# Patient Record
Sex: Male | Born: 1980 | Race: White | Hispanic: No | Marital: Single | Smoking: Current every day smoker
Health system: Southern US, Community
[De-identification: ages and names within clinical notes are randomized; demographics above are authoritative.]

---

## 2001-01-29 ENCOUNTER — Emergency Department (HOSPITAL_COMMUNITY): Admission: EM | Admit: 2001-01-29 | Discharge: 2001-01-29 | Payer: Self-pay | Admitting: Emergency Medicine

## 2001-01-29 ENCOUNTER — Encounter: Payer: Self-pay | Admitting: Emergency Medicine

## 2002-12-28 ENCOUNTER — Emergency Department (HOSPITAL_COMMUNITY): Admission: EM | Admit: 2002-12-28 | Discharge: 2002-12-28 | Payer: Self-pay | Admitting: Emergency Medicine

## 2002-12-28 ENCOUNTER — Ambulatory Visit (HOSPITAL_COMMUNITY): Admission: RE | Admit: 2002-12-28 | Discharge: 2002-12-28 | Payer: Self-pay | Admitting: Emergency Medicine

## 2002-12-28 ENCOUNTER — Encounter: Payer: Self-pay | Admitting: Emergency Medicine

## 2004-06-01 ENCOUNTER — Emergency Department (HOSPITAL_COMMUNITY): Admission: EM | Admit: 2004-06-01 | Discharge: 2004-06-01 | Payer: Self-pay | Admitting: Emergency Medicine

## 2012-01-15 ENCOUNTER — Emergency Department (HOSPITAL_COMMUNITY)
Admission: EM | Admit: 2012-01-15 | Discharge: 2012-01-15 | Disposition: A | Discharge status: Home / Self Care | Payer: 59 | Attending: Emergency Medicine | Admitting: Emergency Medicine

## 2012-01-15 ENCOUNTER — Encounter (HOSPITAL_COMMUNITY): Payer: Self-pay

## 2012-01-15 ENCOUNTER — Emergency Department (HOSPITAL_COMMUNITY): Payer: 59

## 2012-01-15 DIAGNOSIS — R200 Anesthesia of skin: Secondary | ICD-10-CM

## 2012-01-15 DIAGNOSIS — R209 Unspecified disturbances of skin sensation: Secondary | ICD-10-CM | POA: Insufficient documentation

## 2012-01-15 DIAGNOSIS — F17200 Nicotine dependence, unspecified, uncomplicated: Secondary | ICD-10-CM | POA: Insufficient documentation

## 2012-01-15 DIAGNOSIS — F411 Generalized anxiety disorder: Secondary | ICD-10-CM | POA: Insufficient documentation

## 2012-01-15 DIAGNOSIS — R202 Paresthesia of skin: Secondary | ICD-10-CM

## 2012-01-15 LAB — PROTIME-INR
INR: 0.92 (ref 0.00–1.49)
Prothrombin Time: 12.6 seconds (ref 11.6–15.2)

## 2012-01-15 LAB — BASIC METABOLIC PANEL
BUN: 8 mg/dL (ref 6–23)
CO2: 27 mEq/L (ref 19–32)
Calcium: 9.9 mg/dL (ref 8.4–10.5)
Chloride: 99 mEq/L (ref 96–112)
Creatinine, Ser: 0.88 mg/dL (ref 0.50–1.35)
GFR calc Af Amer: >90 mL/min (ref >90)
GFR calc non Af Amer: >90 mL/min (ref >90)
Glucose, Bld: 107 mg/dL — ABNORMAL HIGH (ref 70–99)
Potassium: 4 mEq/L (ref 3.5–5.1)
Sodium: 140 mEq/L (ref 135–145)

## 2012-01-15 LAB — CBC WITH DIFFERENTIAL/PLATELET
Basophils Absolute: 0 10*3/uL (ref 0.0–0.1)
Basophils Relative: 0 % (ref 0–1)
Eosinophils Absolute: 0.3 10*3/uL (ref 0.0–0.7)
Eosinophils Relative: 4 % (ref 0–5)
HCT: 48.6 % (ref 39.0–52.0)
Hemoglobin: 17.3 g/dL — ABNORMAL HIGH (ref 13.0–17.0)
Lymphocytes Relative: 23 % (ref 12–46)
Lymphs Abs: 2.1 10*3/uL (ref 0.7–4.0)
MCH: 31.6 pg (ref 26.0–34.0)
MCHC: 35.6 g/dL (ref 30.0–36.0)
MCV: 88.7 fL (ref 78.0–100.0)
Monocytes Absolute: 0.4 10*3/uL (ref 0.1–1.0)
Monocytes Relative: 5 % (ref 3–12)
Neutro Abs: 6.1 10*3/uL (ref 1.7–7.7)
Neutrophils Relative %: 68 % (ref 43–77)
Platelets: 269 10*3/uL (ref 150–400)
RBC: 5.48 MIL/uL (ref 4.22–5.81)
RDW: 13 % (ref 11.5–15.5)
WBC: 9 10*3/uL (ref 4.0–10.5)

## 2012-01-15 MED ORDER — LORAZEPAM 1 MG PO TABS
0.5000 mg | ORAL_TABLET | Freq: Once | ORAL | Status: AC
  Administered 2012-01-15: 0.5 mg via ORAL
  Filled 2012-01-15: qty 1

## 2012-01-15 NOTE — ED Provider Notes (Signed)
Medical screening examination/treatment/procedure(s) were performed by non-physician practitioner and as supervising physician I was immediately available for consultation/collaboration.\  Gwyneth Sprout, MD 01/15/12 1520

## 2012-01-15 NOTE — ED Provider Notes (Addendum)
History     CSN: [id number]  Arrival date & time 01/15/12  1056   First MD Initiated Contact with Patient 01/15/12 1127      Chief Complaint  Patient presents with  . Numbness    (Consider location/radiation/quality/duration/timing/severity/associated sxs/prior treatment) HPI Comments: Patient presents with sudden onset of L arm numbness that started at about an hour ago. He was sitting at his desk at work when he noticed the sudden onset of L arm numbness and tingling. He admits to associated palpitations. He denies chest pain, SOB, dizziness, nausea. He denies any aggravating and alleviating factors. He has never had this before and has no chronic health conditions. He has no family history of cardiac disease. He is a smoker. He is not currently experiencing symptoms.     History reviewed. No pertinent past medical history.  History reviewed. No pertinent past surgical history.  No family history on file.  History  Substance Use Topics  . Smoking status: Current Everyday Smoker -- 0.5 packs/day  . Smokeless tobacco: Not on file  . Alcohol Use: Yes      Review of Systems  Constitutional: Negative for fever, chills, diaphoresis and fatigue.  Eyes: Negative for photophobia and visual disturbance.  Respiratory: Negative for cough, shortness of breath and wheezing.   Cardiovascular: Positive for palpitations. Negative for chest pain.  Gastrointestinal: Negative for nausea, vomiting, abdominal pain, diarrhea and constipation.  Genitourinary: Negative for flank pain.  Musculoskeletal: Negative for back pain and joint swelling.  Skin: Negative for pallor.  Neurological: Positive for numbness. Negative for dizziness, syncope, light-headedness and headaches.    Allergies  Oak bark  Home Medications  No current outpatient prescriptions on file.  BP 161/88  Pulse 97  Temp 98.4 F (36.9 C) (Oral)  Resp 20  SpO2 100%  Physical Exam  Nursing note and vitals  reviewed. Constitutional: He is oriented to person, place, and time. He appears well-developed and well-nourished. No distress.       Patient appears anxious.   HENT:  Head: Normocephalic and atraumatic.  Mouth/Throat: No oropharyngeal exudate.  Eyes: Conjunctivae are normal. Pupils are equal, round, and reactive to light. No scleral icterus.  Neck: Normal range of motion.  Cardiovascular: Normal rate, regular rhythm and intact distal pulses.  Exam reveals no gallop and no friction rub.   No murmur heard. Pulmonary/Chest: Effort normal. He has no wheezes. He has no rales. He exhibits no tenderness.  Abdominal: Soft. He exhibits no distension. There is no tenderness. There is no rebound and no guarding.  Musculoskeletal: Normal range of motion.  Neurological: He is alert and oriented to person, place, and time.       Strength and sensation intact and equal bilaterally.   Skin: Skin is warm and dry. He is not diaphoretic.  Psychiatric: He has a normal mood and affect. His behavior is normal.    ED Course  Procedures (including critical care time)   Date: 01/15/2012  Rate: 106  Rhythm: sinus tachycardia  QRS Axis: normal  Intervals: normal  ST/T Wave abnormalities: normal  Conduction Disutrbances:none  Narrative Interpretation: normal sinus rhythm with fast rate  Old EKG Reviewed: none available    Labs Reviewed  CBC WITH DIFFERENTIAL - Abnormal; Notable for the following:    Hemoglobin 17.3 (*)     All other components within normal limits  BASIC METABOLIC PANEL - Abnormal; Notable for the following:    Glucose, Bld 107 (*)     All other components  within normal limits  PROTIME-INR   Dg Chest 2 View  01/15/2012  *RADIOLOGY REPORT*  Clinical Data: Chest pain  CHEST - 2 VIEW  Comparison: 06/01/2004  Findings: Normal heart size.  Clear lungs. Hyperaeration.  IMPRESSION: No acute disease.  Original Report Authenticated By: Donavan Burnet, M.D.     No diagnosis  found.    MDM  12:01 PM Patient most likely is experiencing symptoms that are not cardiac in etiology, as his EKG is normal and he is young and healthy. I will not order troponin due to the low likelihood of a cardiac etiology of his symptoms. He is anxious so I ordered 0.5mg  of ativan. I ordered a chest xray to rule out pulmonary etiology. If results are normal, he will be discharged with follow up as needed. Plan discussed with Dr. Anitra Lauth who is agreeable.   1:21 PM The patient's symptoms are resolved upon exam. Patient's results are unremarkable and he is feeling more relaxed. I will discharge him with recommended outpatient follow up for his transient arm numbness.       Emilia Beck, PA-C 01/15/12 [address] PA-C 01/15/12 [address] [place], [place] 01/15/12 [phone]

## 2012-01-15 NOTE — ED Notes (Signed)
Patient transported to X-ray 

## 2012-01-15 NOTE — ED Notes (Signed)
Pt presents with onset of L arm numbness while at work at 1030 today.  Pt denies any pain, reports twitching to L hand and elbow during episode.  Pt denies any numbness to L leg.

## 2012-01-15 NOTE — ED Notes (Signed)
MD at bedside. 

## 2012-01-19 NOTE — ED Provider Notes (Signed)
Medical screening examination/treatment/procedure(s) were performed by non-physician practitioner and as supervising physician I was immediately available for consultation/collaboration.   Gwyneth Sprout, MD 01/19/12 1335

## 2012-07-25 ENCOUNTER — Emergency Department (INDEPENDENT_AMBULATORY_CARE_PROVIDER_SITE_OTHER): Payer: 59

## 2012-07-25 ENCOUNTER — Encounter (HOSPITAL_COMMUNITY): Payer: Self-pay | Admitting: *Deleted

## 2012-07-25 ENCOUNTER — Emergency Department (HOSPITAL_COMMUNITY)
Admission: EM | Admit: 2012-07-25 | Discharge: 2012-07-25 | Disposition: A | Discharge status: Home / Self Care | Payer: 59 | Source: Home / Self Care | Attending: Emergency Medicine | Admitting: Emergency Medicine

## 2012-07-25 DIAGNOSIS — Z23 Encounter for immunization: Secondary | ICD-10-CM

## 2012-07-25 DIAGNOSIS — IMO0002 Reserved for concepts with insufficient information to code with codable children: Secondary | ICD-10-CM

## 2012-07-25 DIAGNOSIS — T148XXA Other injury of unspecified body region, initial encounter: Secondary | ICD-10-CM

## 2012-07-25 MED ORDER — TETANUS-DIPHTH-ACELL PERTUSSIS 5-2.5-18.5 LF-MCG/0.5 IM SUSP
INTRAMUSCULAR | Status: AC
  Filled 2012-07-25: qty 0.5

## 2012-07-25 MED ORDER — TETANUS-DIPHTH-ACELL PERTUSSIS 5-2.5-18.5 LF-MCG/0.5 IM SUSP
0.5000 mL | Freq: Once | INTRAMUSCULAR | Status: AC
  Administered 2012-07-25: 0.5 mL via INTRAMUSCULAR

## 2012-07-25 NOTE — ED Provider Notes (Signed)
Chief Complaint  Patient presents with  . Extremity Laceration    History of Present Illness:   Rickey Edwards is a 32 year old male who lacerated the tip of his left little finger today on a piece of broken glass. He does not think there is any glass in the wound itself. He was able to get the bleeding controlled. There is no numbness or tingling. He has full motion of all his joints. He cannot recall his last tetanus vaccine.  Review of Systems:  Other than noted above, the patient denies any of the following symptoms: Systemic:  No fever or chills. Musculoskeletal:  No joint pain or decreased range of motion. Neuro:  No numbness, tingling, or weakness.  PMFSH:  Past medical history, family history, social history, meds, and allergies were reviewed.  Physical Exam:   Vital signs:  BP 128/80  Pulse 72  Temp(Src) 98.6 F (37 C) (Oral)  SpO2 100% Ext:  There is a 1 cm laceration across the lower aspect of the tip of the left little finger. All of the joints of the digit had a full range of motion.  All other joints had a full ROM without pain.  Pulses were full.  Good capillary refill in all digits.  No edema. Neurological:  Alert and oriented.  No muscle weakness.  Sensation was intact to light touch.   Procedure: Verbal informed consent was obtained.  The patient was informed of the risks and benefits of the procedure and understands and accepts.  Identity of the patient was verified verbally and by wristband.   The laceration area described above was prepped with Betadine and saline  and anesthetized with a ring block with 5 mL of 2% Xylocaine without epinephrine.  The wound was then closed as follows:  Skin edges were approximated with 3 5-0 nylon sutures.  There were no immediate complications, and the patient tolerated the procedure well. The laceration was then cleansed, Bacitracin ointment was applied and a clean, dry pressure dressing was put on.   Medications given in [facility]:  He was  given a Tdap vaccine.  Assessment:  The encounter diagnosis was Laceration.  Plan:   1.  The following meds were prescribed:  There are no discharge medications for this patient.  2.  The patient was instructed in wound care and pain control, and handouts were given. 3.  The patient was told to return in 14 days for suture removal or wound recheck or sooner if any sign of infection.    Reuben Likes, MD 07/25/12 [phone]

## 2012-07-25 NOTE — ED Notes (Signed)
PT  SUSTAINED  A  LAC  TO  L  PINKY  TODAY  WILL  CHAGING A  LIGHT   BLEEDING  SUBSIDED       BUT  WILL PROBALY  NEED  STITCHES   ROM IS  PRESENT  POSS  GL;ASS  FB

## 2012-07-25 NOTE — ED Notes (Signed)
PT  SUSTAINED  A     SUPERFICIAL

## 2013-08-01 ENCOUNTER — Emergency Department (INDEPENDENT_AMBULATORY_CARE_PROVIDER_SITE_OTHER)
Admission: EM | Admit: 2013-08-01 | Discharge: 2013-08-01 | Disposition: A | Discharge status: Home / Self Care | Payer: 59 | Source: Home / Self Care | Attending: Emergency Medicine | Admitting: Emergency Medicine

## 2013-08-01 ENCOUNTER — Encounter (HOSPITAL_COMMUNITY): Payer: Self-pay | Admitting: Emergency Medicine

## 2013-08-01 ENCOUNTER — Emergency Department (INDEPENDENT_AMBULATORY_CARE_PROVIDER_SITE_OTHER): Payer: 59

## 2013-08-01 DIAGNOSIS — S93609A Unspecified sprain of unspecified foot, initial encounter: Secondary | ICD-10-CM

## 2013-08-01 MED ORDER — HYDROCODONE-ACETAMINOPHEN 5-325 MG PO TABS: ORAL_TABLET | ORAL | Status: DC

## 2013-08-01 NOTE — Discharge Instructions (Signed)
Ligament Sprain  Ligaments are tough, fibrous tissues that hold bones together at the joints. A sprain can occur when a ligament is stretched. This injury may take several weeks to heal.  HOME CARE INSTRUCTIONS   · Rest the injured area for as long as directed by your caregiver. Then slowly start using the joint as directed by your caregiver and as the pain allows.  · Keep the affected joint raised if possible to lessen swelling.  · Apply ice for 15-20 minutes to the injured area every couple hours for the first half day, then 03-04 times per day for the first 48 hours. Put the ice in a plastic bag and place a towel between the bag of ice and your skin.  · Wear any splinting, casting, or elastic bandage applications as instructed.  · Only take over-the-counter or prescription medicines for pain, discomfort, or fever as directed by your caregiver. Do not use aspirin immediately after the injury unless instructed by your caregiver. Aspirin can cause increased bleeding and bruising of the tissues.  · If you were given crutches, continue to use them as instructed and do not resume weight bearing on the affected extremity until instructed.  SEEK MEDICAL CARE IF:   · Your bruising, swelling, or pain increases.  · You have cold and numb fingers or toes if your arm or leg was injured.  SEEK IMMEDIATE MEDICAL CARE IF:   · Your toes are numb or blue if your leg was injured.  · Your fingers are numb or blue if your arm was injured.  · Your pain is not responding to medicines and continues to stay the same or gets worse.  MAKE SURE YOU:   · Understand these instructions.  · Will watch your condition.  · Will get help right away if you are not doing well or get worse.  Document Released: 05/16/2000 Document Revised: 08/11/2011 Document Reviewed: 03/14/2008  [employer]® Patient Information ©2014 [employer].

## 2013-08-01 NOTE — ED Provider Notes (Signed)
Chief Complaint   Chief Complaint  Patient presents with  . Foot Injury    History of Present Illness   Rickey Edwards is a 33 year old male who twisted his foot yesterday. Ever since then he's had pain over the proximal end of the fifth metatarsal. There is some bruising proximal to that. It hurts to walk on that side of the foot. There is no numbness or tingling. He denies any ankle pain.  Review of Systems   Other than as noted above, the patient denies any of the following symptoms: Systemic:  No fevers, chills, or sweats.  No fatigue or tiredness. Musculoskeletal:  No joint pain, arthritis, bursitis, swelling, or back pain.  Neurological:  No muscular weakness, paresthesias.   PMFSH   Past medical history, family history, social history, meds, and allergies were reviewed.     Physical  Examination     Vital signs:  BP 150/94  Pulse 82  Temp(Src) 99 F (37.2 C) (Oral)  Resp 16  SpO2 98% Gen:  Alert and oriented times 3.  In no distress. Musculoskeletal:  Exam of the foot reveals pain to palpation over the proximal end of the fifth metatarsal. There is some bruising proximal to that. Ankle has a full range of motion with no pain.  Otherwise, all joints had a full a ROM with no swelling, bruising or deformity.  No edema, pulses full. Extremities were warm and pink.  Capillary refill was brisk.  Skin:  Clear, warm and dry.  No rash. Neuro:  Alert and oriented times 3.  Muscle strength was normal.  Sensation was intact to light touch.    Radiology   Dg Foot Complete Left  08/01/2013   ADDENDUM REPORT: 08/01/2013 18:41  ADDENDUM: Voice recognition error : Impression should state " No osseous abnormality."   Electronically Signed   By: Genevive Bi M.D.   On: 08/01/2013 18:41   08/01/2013   CLINICAL DATA:  Foot pain  EXAM: LEFT FOOT - COMPLETE 3+ VIEW  COMPARISON:  None.  FINDINGS: No fracture or dislocation of mid foot or forefoot. The phalanges are normal. The calcaneus is  normal. No soft tissue abnormality.  IMPRESSION: No acute cardiopulmonary process.  Electronically Signed: By: Genevive Bi M.D. On: 08/01/2013 18:28   I reviewed the images independently and personally and concur with the radiologist's findings.  [facility]   Put in a Cam Walker boot.  Assessment   The encounter diagnosis was Foot sprain.  Suggest she wear the Cam Walker boot for the next 2 weeks. Return again at the end of that time if not completely resolved.  Plan    1.  Meds:  The following meds were prescribed:   Discharge Medication List as of 08/01/2013  6:44 PM    START taking these medications   Details  HYDROcodone-acetaminophen (NORCO/VICODIN) 5-325 MG per tablet 1 to 2 tabs every 4 to 6 hours as needed for pain., Print        2.  Patient Education/Counseling:  The patient was given appropriate handouts, self care instructions, and instructed in symptomatic relief including rest and activity, elevation, application of ice and compression.    3.  Follow up:  The patient was told to follow up here if no better in 3 to 4 days, or sooner if becoming worse in any way, and given some red flag symptoms such as worsening pain or neurological symptoms which would prompt immediate return.  Follow up here as  necessary.       Reuben Likesavid C Daquawn Seelman, MD 08/01/13 2200

## 2013-08-01 NOTE — ED Notes (Signed)
Reports when at home sitting on left foot.  States foot was numb and upon standing rolled foot inward.  C/o pain and bruising of the lateral side of left foot.  Pain felt with walking.  Pt has used ice, ibuprofen, and elevation with mild relief.  Incident happened on Sunday.

## 2015-09-01 IMAGING — CR DG FOOT COMPLETE 3+V*L*
3 series · 3 of 3 positions shown · non-contrast
Comparison: None.

ADDENDUM:
Voice recognition error : Impression should state " No osseous
abnormality."
CLINICAL DATA: Foot pain

EXAM:
LEFT FOOT - COMPLETE 3+ VIEW

[view not recorded (1 of 3)]
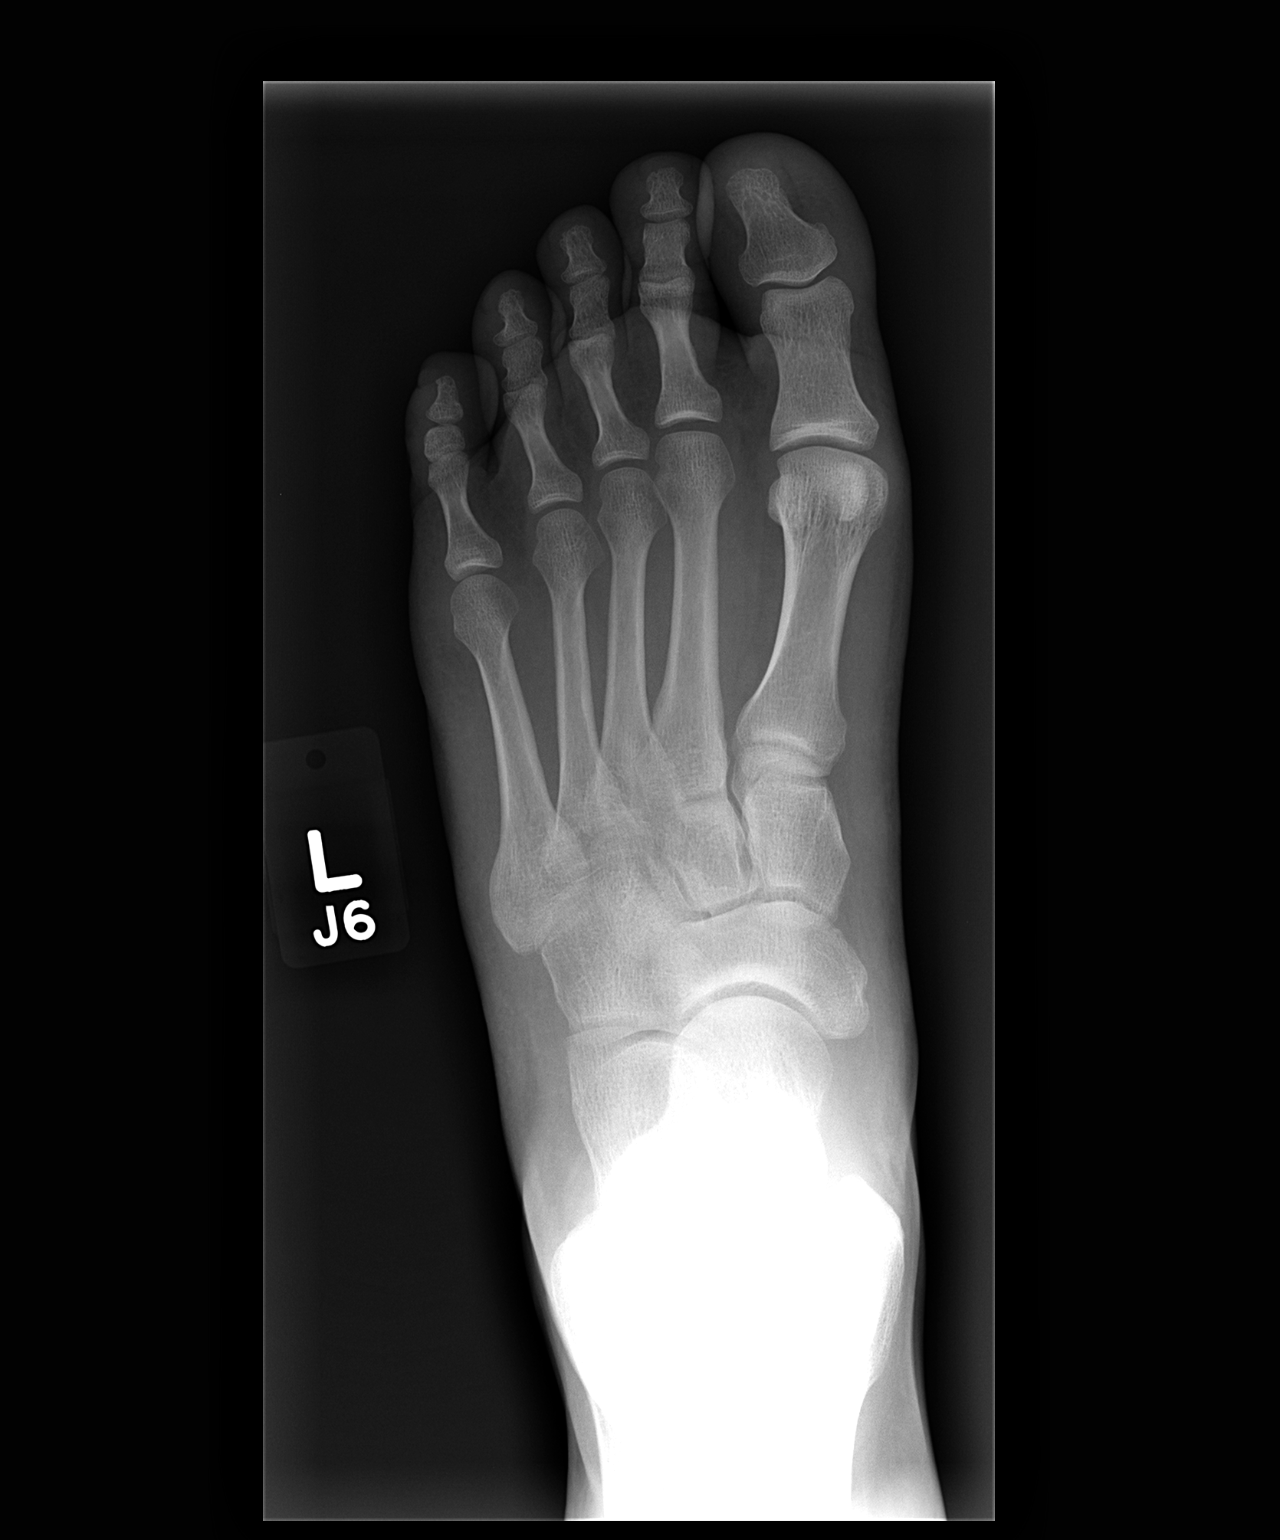

[view not recorded (2 of 3)]
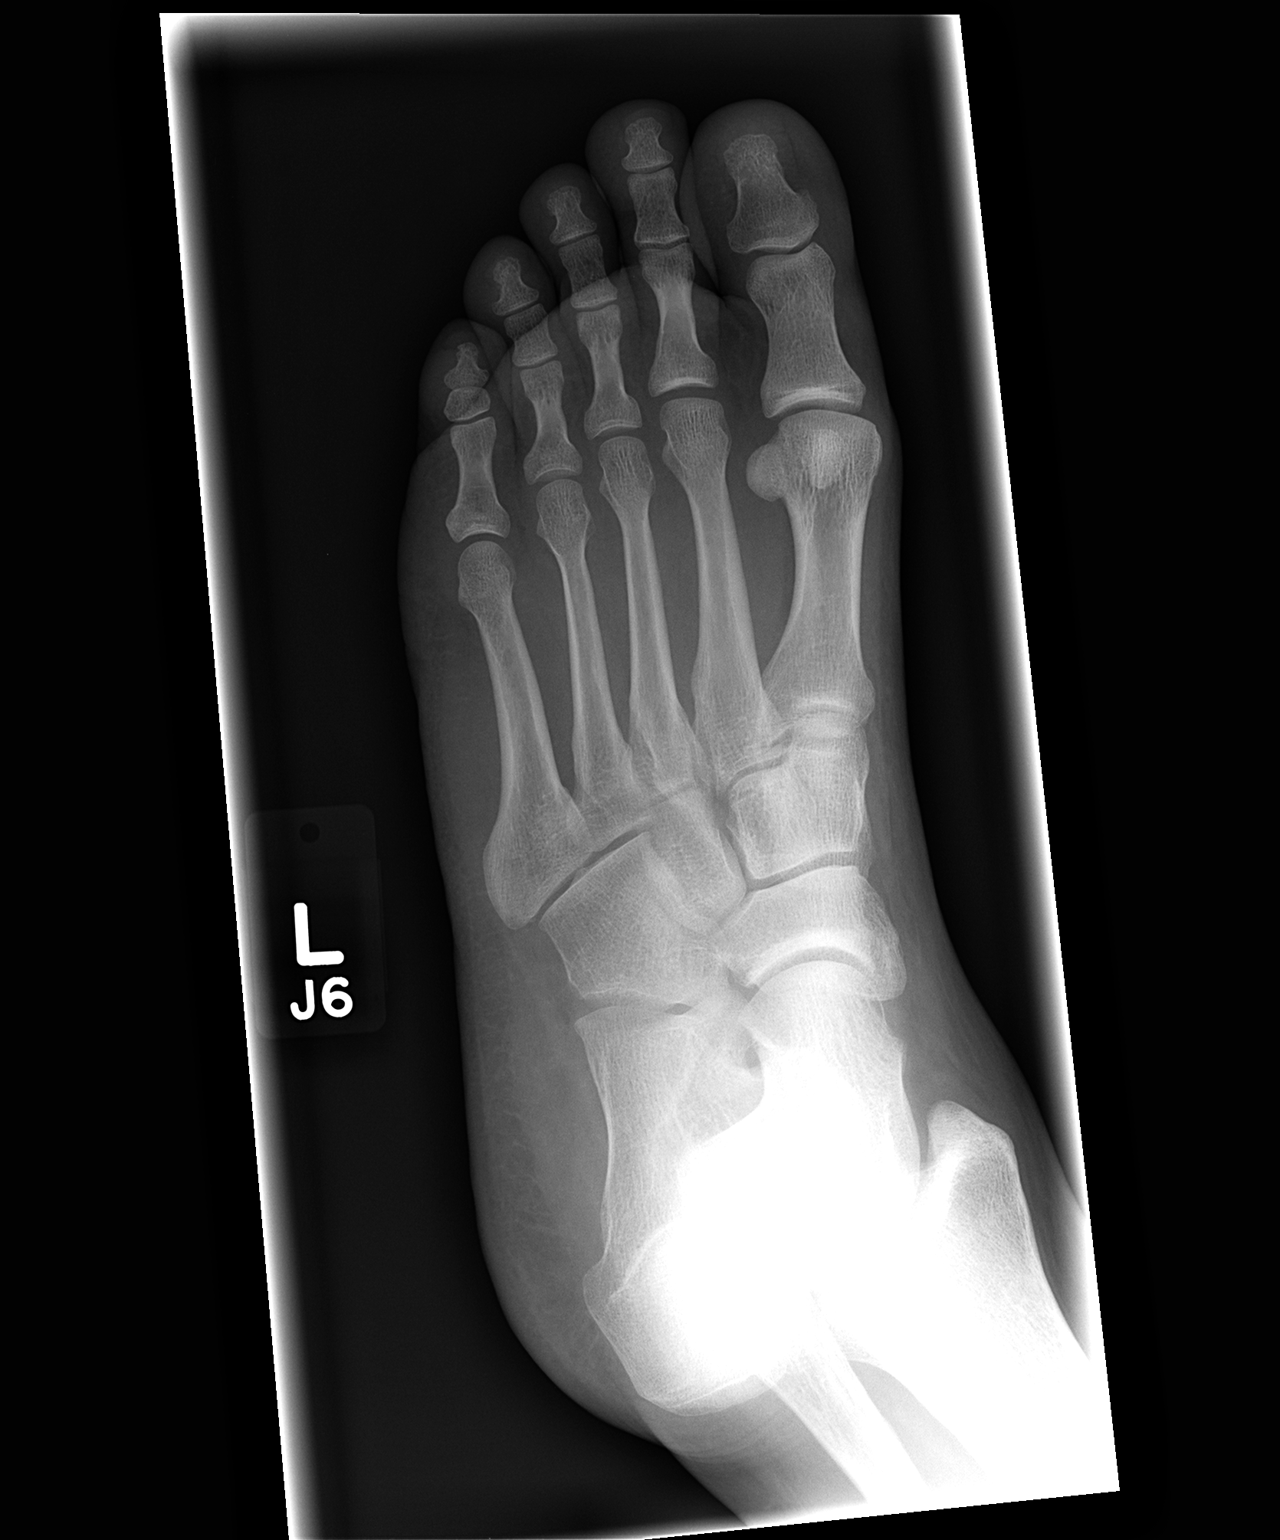

[view not recorded (3 of 3)]
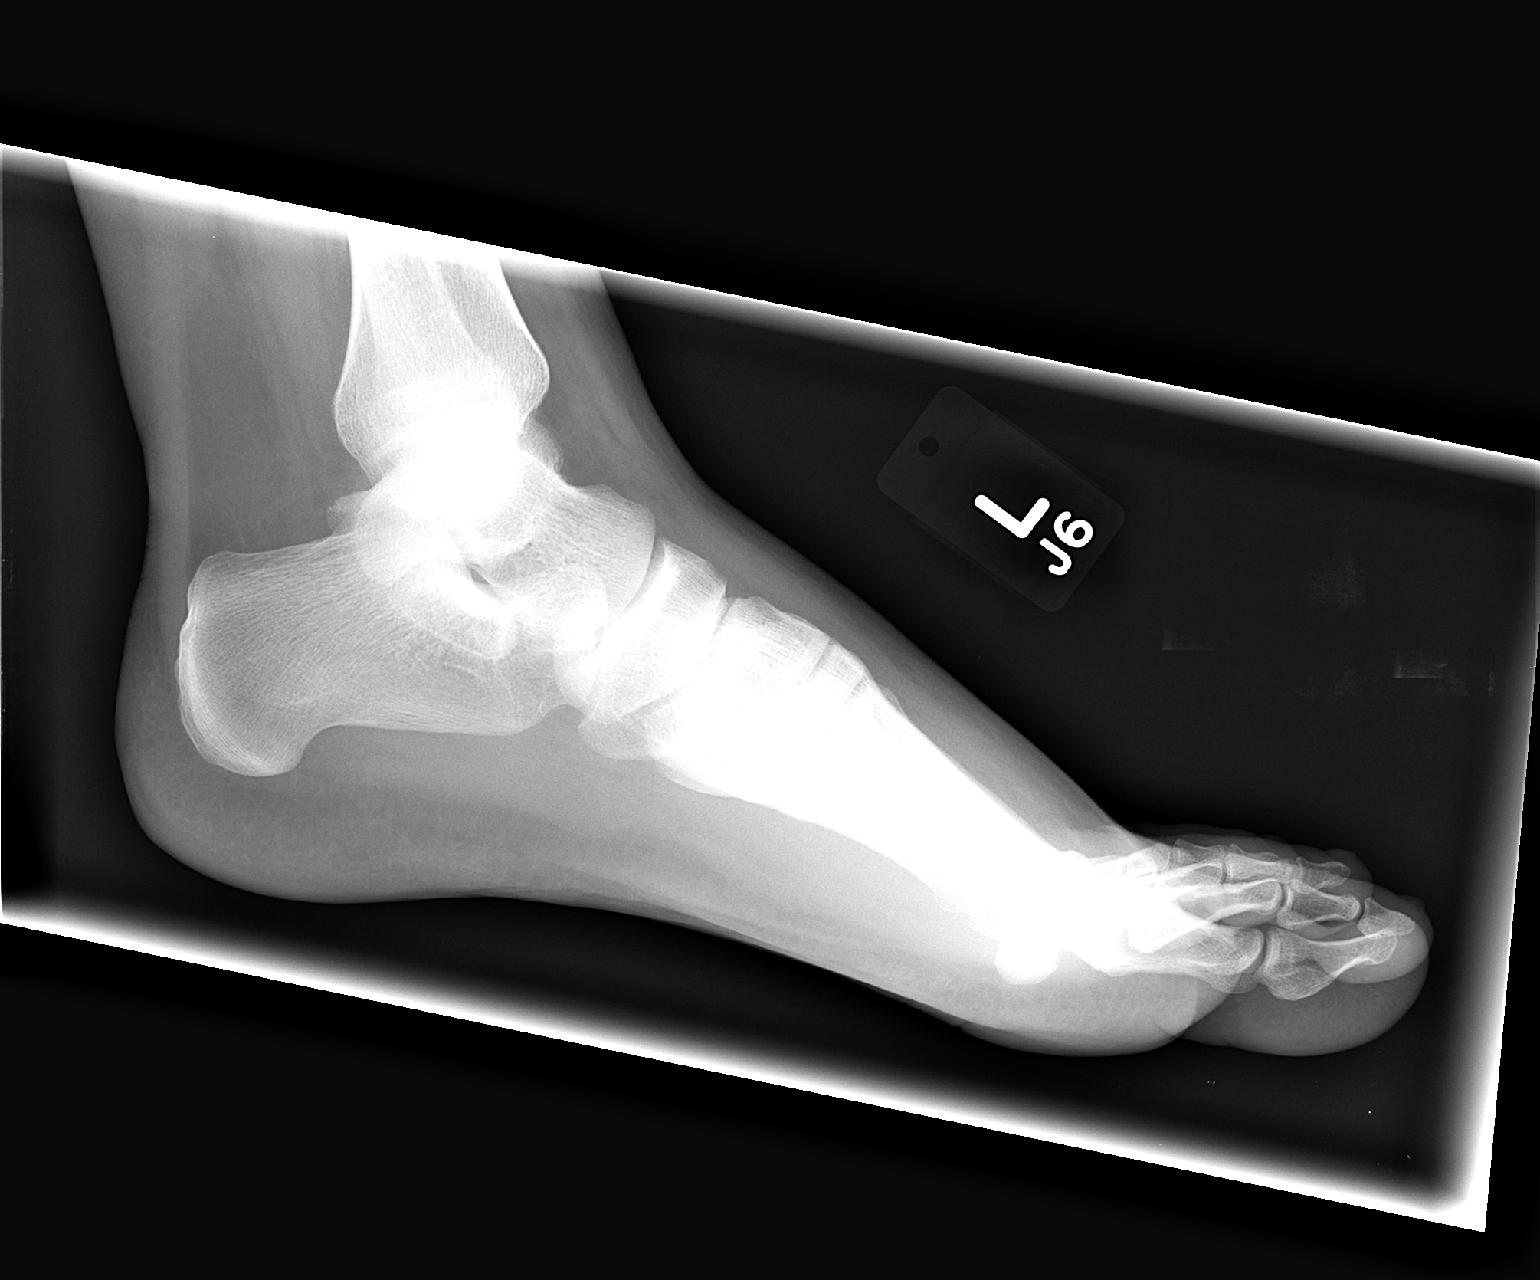

[3 of 3 positions shown; findings below may reference images not displayed]

FINDINGS: No fracture or dislocation of mid foot or forefoot. The phalanges
are normal. The calcaneus is normal. No soft tissue abnormality.
IMPRESSION: No acute cardiopulmonary process.

## 2018-02-15 ENCOUNTER — Encounter (HOSPITAL_COMMUNITY): Payer: Self-pay

## 2018-02-15 ENCOUNTER — Ambulatory Visit (HOSPITAL_COMMUNITY)
Admission: EM | Admit: 2018-02-15 | Discharge: 2018-02-15 | Disposition: A | Discharge status: Home / Self Care | Payer: 59 | Attending: Family Medicine | Admitting: Family Medicine

## 2018-02-15 DIAGNOSIS — G5622 Lesion of ulnar nerve, left upper limb: Secondary | ICD-10-CM | POA: Diagnosis not present

## 2018-02-15 MED ORDER — PREDNISONE 5 MG PO TABS: ORAL_TABLET | ORAL | 0 refills | Status: DC

## 2018-02-15 NOTE — ED Triage Notes (Signed)
Pt presents with with left arm and hand pain and numbness.

## 2018-02-15 NOTE — Discharge Instructions (Signed)
Please try the prednisone and see how this affects this area. Please try to avoid any irritation to the elbow. Please try Aspercreme with lidocaine over the area. Please follow-up with your symptoms do not seem to be improving.

## 2018-02-15 NOTE — ED Provider Notes (Signed)
[facility]    CSN: [id number] Arrival date & time: 02/15/18  1759     History   Chief Complaint Chief Complaint  Patient presents with  . Left arm and hand pain & numbness    HPI Rickey Edwards is a 37 y.o. male.   He is presenting with an altered sensation in the left elbow.  It originates at the cubital tunnel and goes distally to the ulnar aspect of the fourth and fifth digit of his hand.  He feels this is been going on for 2-1/2 to 3 weeks.  He does sit at a desk with resting his elbows on a chair.  He has not tried anything for the sensation.  He denies any repetitive movements outside of that work environment.  He denies any inciting event.  Denies any prior injury or surgery over this area.  Feels like the symptoms are intermittent and staying the same.  HPI  History reviewed. No pertinent past medical history.  There are no active problems to display for this patient.   History reviewed. No pertinent surgical history.     Home Medications    Prior to Admission medications   Medication Sig Start Date End Date Taking? Authorizing Provider  HYDROcodone-acetaminophen (NORCO/VICODIN) 5-325 MG per tablet 1 to 2 tabs every 4 to 6 hours as needed for pain. 08/01/13   Reuben Likes, MD  predniSONE (DELTASONE) 5 MG tablet Take 6 pills for first day, 5 pills second day, 4 pills third day, 3 pills fourth day, 2 pills the fifth day, and 1 pill sixth day. 02/15/18   Myra Rude, MD    Family History History reviewed. No pertinent family history.  Social History Social History   Tobacco Use  . Smoking status: Current Every Day Smoker    Packs/day: 0.50  Substance Use Topics  . Alcohol use: Yes  . Drug use: No     Allergies   Oak bark [quercus robur]   Review of Systems Review of Systems  Constitutional: Negative for fever.  HENT: Negative for congestion.   Respiratory: Negative for cough.   Cardiovascular: Negative for chest pain.    Gastrointestinal: Negative for abdominal pain.  Musculoskeletal: Negative for gait problem.  Skin: Negative for color change.  Neurological: Negative for weakness.  Hematological: Negative for adenopathy.  Psychiatric/Behavioral: Negative for agitation.     Physical Exam Triage Vital Signs ED Triage Vitals [02/15/18 1858]  Enc Vitals Group     BP (!) 144/88     Pulse Rate 73     Resp 20     Temp 98.4 F (36.9 C)     Temp Source Oral     SpO2 94 %     Weight      Height      Head Circumference      Peak Flow      Pain Score      Pain Loc      Pain Edu?      Excl. in GC?    No data found.  Updated Vital Signs BP (!) 144/88 (BP Location: Left Arm)   Pulse 73   Temp 98.4 F (36.9 C) (Oral)   Resp 20   SpO2 94%   Visual Acuity Right Eye Distance:   Left Eye Distance:   Bilateral Distance:    Right Eye Near:   Left Eye Near:    Bilateral Near:     Physical Exam Gen: NAD, alert, cooperative with  exam, well-appearing ENT: normal lips, normal nasal mucosa,  Eye: normal EOM, normal conjunctiva and lids CV:  no edema, +2 pedal pulses   Resp: no accessory muscle use, non-labored,  Skin: no rashes, no areas of induration  Neuro: normal tone, normal sensation to touch Psych:  normal insight, alert and oriented MSK:  Left elbow: No tenderness to palpation of the medial lateral epicondyle. No elbow range of motion. Normal elbow strength resistance with flexion extension. Mild reproduction of symptoms with Tinel's at the cubital tunnel. No signs of atrophy. Normal wrist range of motion. Normal grip strength. Normal finger abduction and abduction strength resistance. Normal thumb resistance to extension. Normal pincer grasp. No pain with resistance to middle finger extension. Neurovascularly intact  UC Treatments / Results  Labs (all labs ordered are listed, but only abnormal results are displayed) Labs Reviewed - No data to  display  EKG None  Radiology No results found.  Procedures Procedures (including critical care time)  Medications Ordered in UC Medications - No data to display  Initial Impression / Assessment and Plan / UC Course  I have reviewed the triage vital signs and the nursing notes.  Pertinent labs & imaging results that were available during my care of the patient were reviewed by me and considered in my medical decision making (see chart for details).     Jeannett SeniorStephen is a 37 year old male that is presenting with symptoms that are suggestive of ulnar nerve irritation.  Is likely related to his work environment as he rest his elbows on his chair while working.  Does not appear to be associated with any form of injury or trauma.  No signs of atrophy on exam to suggest significant impingement.  We will try some prednisone and counseled on different ways to avoid irritation to this area.  If there is no improvement will consider physical therapy or an EMG.  Final Clinical Impressions(s) / UC Diagnoses   Final diagnoses:  Neuritis of left ulnar nerve     Discharge Instructions     Please try the prednisone and see how this affects this area. Please try to avoid any irritation to the elbow. Please try Aspercreme with lidocaine over the area. Please follow-up with your symptoms do not seem to be improving.    ED Prescriptions    Medication Sig Dispense Auth. Provider   predniSONE (DELTASONE) 5 MG tablet Take 6 pills for first day, 5 pills second day, 4 pills third day, 3 pills fourth day, 2 pills the fifth day, and 1 pill sixth day. 21 tablet Myra RudeSchmitz, Keeana Pieratt E, MD     Controlled Substance Prescriptions [place] Controlled Substance Registry consulted? Not Applicable   Myra RudeSchmitz, Tamee Battin E, MD 02/15/18 2057

## 2018-12-05 ENCOUNTER — Encounter (HOSPITAL_COMMUNITY): Payer: Self-pay | Admitting: Emergency Medicine

## 2018-12-05 ENCOUNTER — Emergency Department (HOSPITAL_COMMUNITY)
Admission: EM | Admit: 2018-12-05 | Discharge: 2018-12-05 | Disposition: A | Discharge status: Home / Self Care | Payer: 59 | Attending: Emergency Medicine | Admitting: Emergency Medicine

## 2018-12-05 ENCOUNTER — Emergency Department (HOSPITAL_COMMUNITY): Payer: 59

## 2018-12-05 ENCOUNTER — Other Ambulatory Visit: Payer: Self-pay

## 2018-12-05 DIAGNOSIS — X500XXA Overexertion from strenuous movement or load, initial encounter: Secondary | ICD-10-CM | POA: Insufficient documentation

## 2018-12-05 DIAGNOSIS — Y9383 Activity, rough housing and horseplay: Secondary | ICD-10-CM | POA: Diagnosis not present

## 2018-12-05 DIAGNOSIS — Y929 Unspecified place or not applicable: Secondary | ICD-10-CM | POA: Diagnosis not present

## 2018-12-05 DIAGNOSIS — Y999 Unspecified external cause status: Secondary | ICD-10-CM | POA: Diagnosis not present

## 2018-12-05 DIAGNOSIS — S39012A Strain of muscle, fascia and tendon of lower back, initial encounter: Secondary | ICD-10-CM | POA: Diagnosis not present

## 2018-12-05 DIAGNOSIS — F17200 Nicotine dependence, unspecified, uncomplicated: Secondary | ICD-10-CM | POA: Insufficient documentation

## 2018-12-05 DIAGNOSIS — S3992XA Unspecified injury of lower back, initial encounter: Secondary | ICD-10-CM | POA: Diagnosis present

## 2018-12-05 LAB — URINALYSIS, ROUTINE W REFLEX MICROSCOPIC
Bilirubin Urine: NEGATIVE
Glucose, UA: NEGATIVE mg/dL
Hgb urine dipstick: NEGATIVE
Ketones, ur: NEGATIVE mg/dL
Leukocytes,Ua: NEGATIVE
Nitrite: NEGATIVE
Protein, ur: NEGATIVE mg/dL
Specific Gravity, Urine: 1.012 (ref 1.005–1.030)
pH: 5 (ref 5.0–8.0)

## 2018-12-05 MED ORDER — TRAMADOL HCL 50 MG PO TABS: 50.0000 mg | ORAL_TABLET | Freq: Four times a day (QID) | ORAL | 0 refills | Status: AC | PRN

## 2018-12-05 MED ORDER — CYCLOBENZAPRINE HCL 10 MG PO TABS: 10.0000 mg | ORAL_TABLET | Freq: Three times a day (TID) | ORAL | 0 refills | Status: AC | PRN

## 2018-12-05 MED ORDER — PREDNISONE 10 MG PO TABS: ORAL_TABLET | ORAL | 0 refills | Status: AC

## 2018-12-05 NOTE — ED Notes (Signed)
EDP at bedside  

## 2018-12-05 NOTE — ED Notes (Signed)
Pt aware a urine specimen is needed. Pt will notify staff when one can be obtained.  

## 2018-12-05 NOTE — Discharge Instructions (Addendum)
Apply ice packs on/off to your back.  Avoid heavy lifting, twisting or bending over for at least 1 week.  Follow-up with your doctor for recheck or return here for any worsening symptoms

## 2018-12-05 NOTE — ED Triage Notes (Signed)
Pt reports he and his child were wrestling and he was hit in right lower back tonight, now having pain to that area

## 2018-12-06 NOTE — ED Provider Notes (Signed)
[facility] Provider Note   CSN: [id number] Arrival date & time: 12/05/18  0137     History   Chief Complaint Chief Complaint  Patient presents with  . Back Pain    HPI Rickey LoftsStephen Edwards is a 38 y.o. male.     HPI  Rickey LoftsStephen Edwards is a 38 y.o. male who presents to the Emergency Department complaining of sudden onset of right sided low back pain that began several hours ago after horse playing with his children.  He describes a sharp pain to his right lower back that is associated with movement.  Pain improves at rest, it does not radiate.  He denies pain, numbness or weakness of the lower extremities, urine or bowel changes, abdominal pain, fever or chills.  No hx of previous back pain     History reviewed. No pertinent past medical history.  There are no active problems to display for this patient.   History reviewed. No pertinent surgical history.      Home Medications    Prior to Admission medications   Medication Sig Start Date End Date Taking? Authorizing Provider  cyclobenzaprine (FLEXERIL) 10 MG tablet Take 1 tablet (10 mg total) by mouth 3 (three) times daily as needed. 12/05/18   Daemian Gahm, PA-C  predniSONE (DELTASONE) 10 MG tablet Take 6 tablets day one, 5 tablets day two, 4 tablets day three, 3 tablets day four, 2 tablets day five, then 1 tablet day six 12/05/18   Carly Sabo, PA-C  traMADol (ULTRAM) 50 MG tablet Take 1 tablet (50 mg total) by mouth every 6 (six) hours as needed. 12/05/18   Pauline Ausriplett, Mikal Wisman, PA-C    Family History History reviewed. No pertinent family history.  Social History Social History   Tobacco Use  . Smoking status: Current Every Day Smoker    Packs/day: 0.50  . Smokeless tobacco: Never Used  Substance Use Topics  . Alcohol use: Yes  . Drug use: No     Allergies   Oak bark [quercus robur]   Review of Systems Review of Systems  Constitutional: Negative for fever.  Respiratory: Negative for shortness  of breath.   Gastrointestinal: Negative for abdominal pain, constipation and vomiting.  Genitourinary: Negative for decreased urine volume, difficulty urinating, dysuria, flank pain and hematuria.  Musculoskeletal: Positive for back pain. Negative for joint swelling.  Skin: Negative for rash.  Neurological: Negative for weakness and numbness.     Physical Exam Updated Vital Signs BP 136/88 (BP Location: Right Arm)   Pulse 86   Temp 98.6 F (37 C) (Oral)   Resp 16   Ht 5\' 9"  (1.753 m)   Wt 95.3 kg   SpO2 95%   BMI 31.01 kg/m   Physical Exam Vitals signs and nursing note reviewed.  Constitutional:      General: He is not in acute distress.    Appearance: He is well-developed. He is not ill-appearing.  Neck:     Musculoskeletal: Normal range of motion.  Cardiovascular:     Rate and Rhythm: Normal rate and regular rhythm.     Comments: DP pulses are strong and palpable bilaterally Pulmonary:     Effort: Pulmonary effort is normal. No respiratory distress.     Breath sounds: Normal breath sounds.  Abdominal:     General: There is no distension.     Palpations: Abdomen is soft.     Tenderness: There is no abdominal tenderness.  Musculoskeletal:        General:  Tenderness present.     Lumbar back: He exhibits tenderness and pain. He exhibits normal range of motion, no swelling, no deformity, no laceration and normal pulse.     Comments: ttp of the right lumbar paraspinal muscles.  No spinal tenderness.  Pt has 5/5 strength against resistance of bilateral lower extremities.  Neg SLR bilaterally.     Skin:    General: Skin is warm.     Capillary Refill: Capillary refill takes less than 2 seconds.     Findings: No rash.  Neurological:     General: No focal deficit present.     Mental Status: He is alert.     Sensory: No sensory deficit.     Motor: No weakness or abnormal muscle tone.     Gait: Gait normal.     Deep Tendon Reflexes:     Reflex Scores:      Patellar  reflexes are 2+ on the right side and 2+ on the left side.      Achilles reflexes are 2+ on the right side and 2+ on the left side.     ED Treatments / Results  Labs (all labs ordered are listed, but only abnormal results are displayed) Labs Reviewed  URINALYSIS, ROUTINE W REFLEX MICROSCOPIC    EKG None  Radiology Ct Renal Stone Study  Result Date: 12/05/2018 CLINICAL DATA:  Right lower back pain after injury. EXAM: CT ABDOMEN AND PELVIS WITHOUT CONTRAST TECHNIQUE: Multidetector CT imaging of the abdomen and pelvis was performed following the standard protocol without IV contrast. COMPARISON:  None. FINDINGS: Lower chest: No acute abnormality. Hepatobiliary: No focal liver abnormality is seen. No gallstones, gallbladder wall thickening, or biliary dilatation. Pancreas: Unremarkable. No pancreatic ductal dilatation or surrounding inflammatory changes. Spleen: Normal in size without focal abnormality. Adrenals/Urinary Tract: Adrenal glands are unremarkable. Kidneys are normal, without renal calculi, focal lesion, or hydronephrosis. Bladder is unremarkable. Stomach/Bowel: Stomach is within normal limits. Appendix appears normal. No evidence of bowel wall thickening, distention, or inflammatory changes. Vascular/Lymphatic: No significant vascular findings are present. No enlarged abdominal or pelvic lymph nodes. Reproductive: Prostate is unremarkable. Other: No abdominal wall hernia or abnormality. No abdominopelvic ascites. Musculoskeletal: No acute or significant osseous findings. IMPRESSION: No abnormality seen in the abdomen or pelvis. Electronically Signed   By: Marijo Conception M.D.   On: 12/05/2018 08:49    Procedures Procedures (including critical care time)  Medications Ordered in ED Medications - No data to display   Initial Impression / Assessment and Plan / ED Course  I have reviewed the triage vital signs and the nursing notes.  Pertinent labs & imaging results that were  available during my care of the patient were reviewed by me and considered in my medical decision making (see chart for details).        Pt with likely musculoskeletal injury. Work up here is reassuring.  NV intact.  No motor weakness, he ambulates with a steady gait. He agrees to tx plan, ice and close out pt f/u.  Return precautions discussed.   Final Clinical Impressions(s) / ED Diagnoses   Final diagnoses:  Strain of lumbar region, initial encounter    ED Discharge Orders         Ordered    predniSONE (DELTASONE) 10 MG tablet     12/05/18 0921    cyclobenzaprine (FLEXERIL) 10 MG tablet  3 times daily PRN     12/05/18 0921    traMADol (ULTRAM) 50 MG tablet  Every 6 hours PRN     12/05/18 0921           Pauline Ausriplett, Deno Sida, PA-C 12/06/18 2119    Samuel JesterMcManus, Kathleen, DO 12/09/18 [phone]

## 2021-01-04 IMAGING — CT CT RENAL STONE PROTOCOL
2 of 4 series · 17 of 46 positions shown, 19 images · non-contrast
Comparison: None.

CLINICAL DATA: Right lower back pain after injury.

EXAM:
CT ABDOMEN AND PELVIS WITHOUT CONTRAST
TECHNIQUE: Multidetector CT imaging of the abdomen and pelvis was performed
following the standard protocol without IV contrast.

[Series 2: axial st · axial · 0.78mm/px · z∈[-182,+243]mm · 14 of 97 slices shown, 16 images]
[im 6/97  soft-tissue]
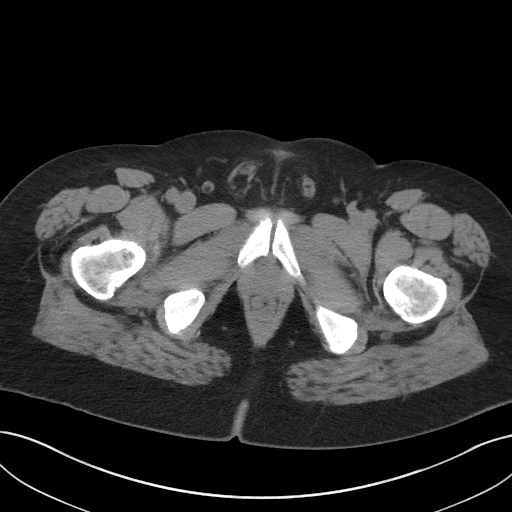
[im 6/97  bone]
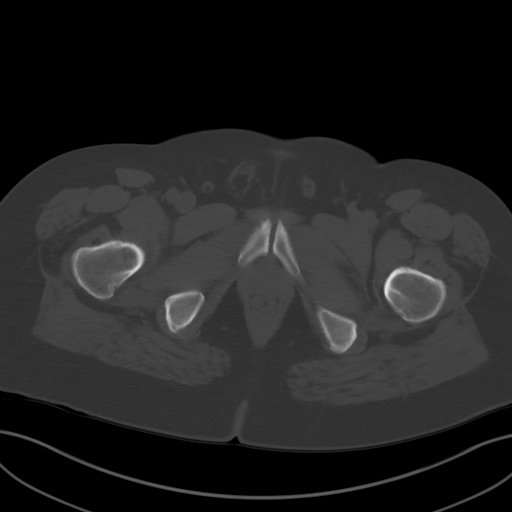
[im 12/97  soft-tissue]
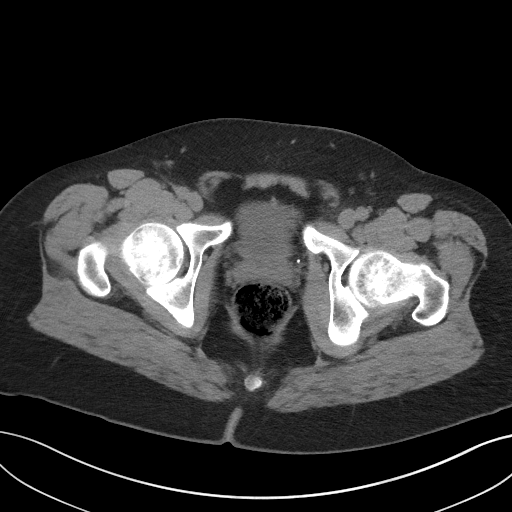
[im 17/97  soft-tissue]
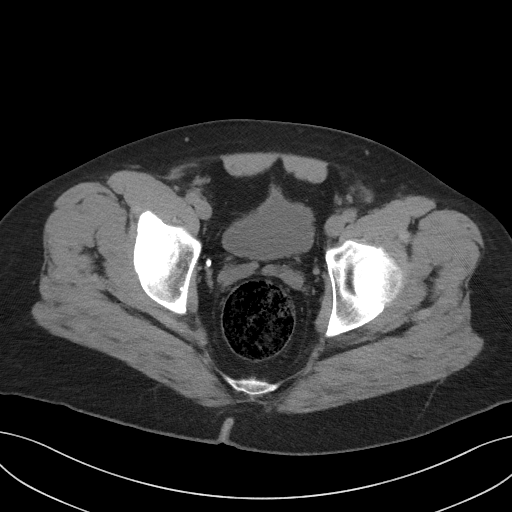
[im 29/97  soft-tissue]
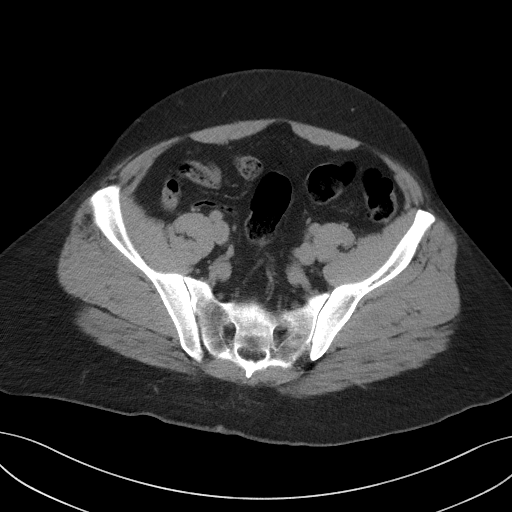
[im 34/97  soft-tissue]
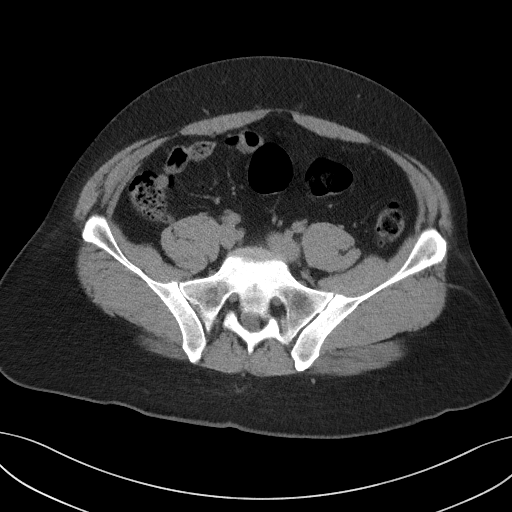
[im 40/97  soft-tissue]
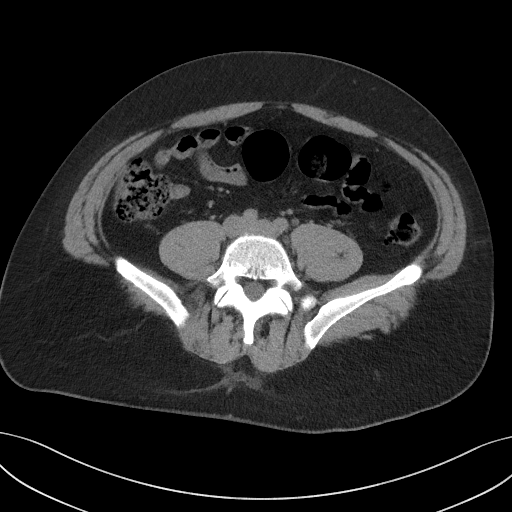
[im 46/97  soft-tissue]
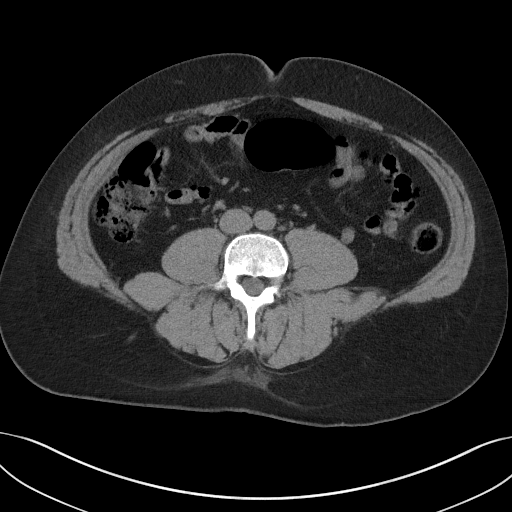
[im 51/97  soft-tissue]
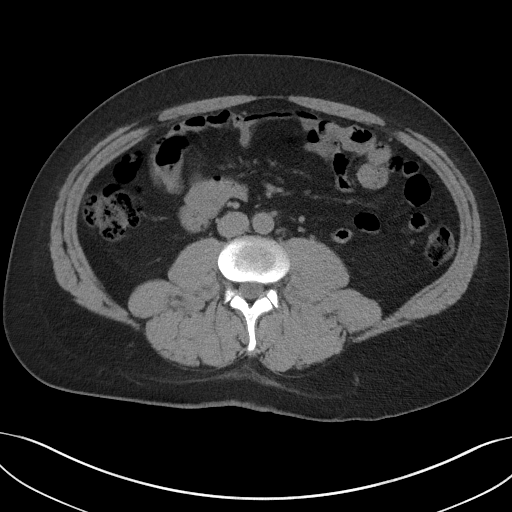
[im 57/97  soft-tissue]
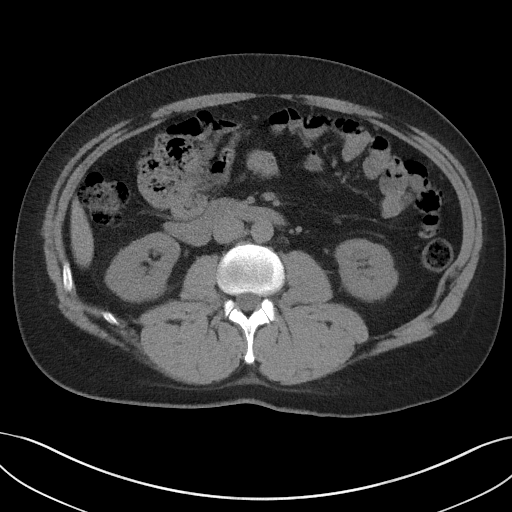
[im 57/97  bone]
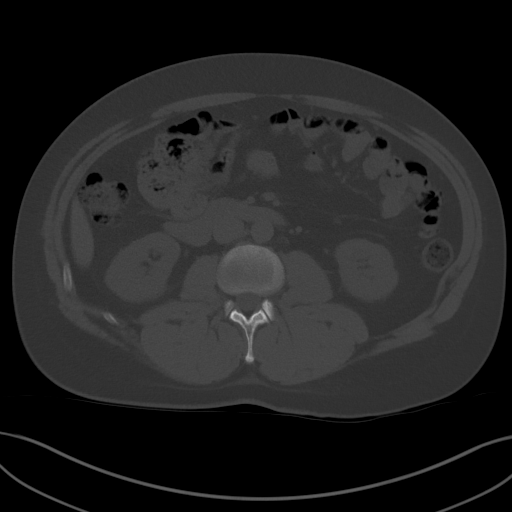
[im 63/97  soft-tissue]
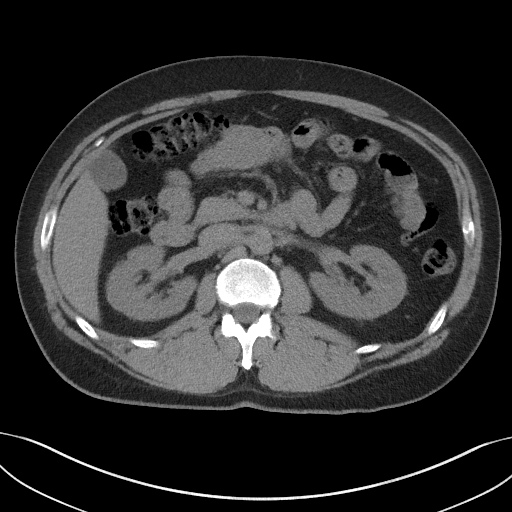
[im 74/97  soft-tissue]
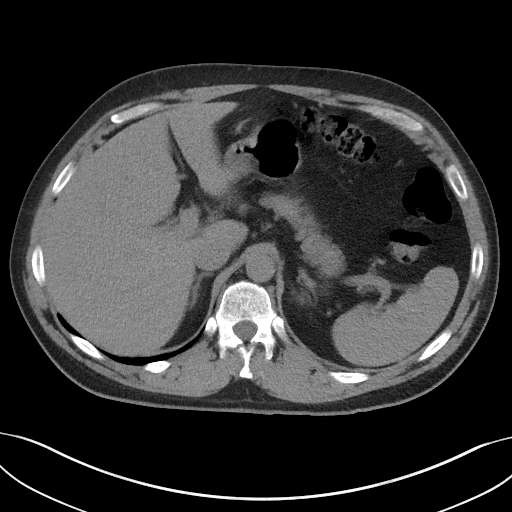
[im 80/97  soft-tissue]
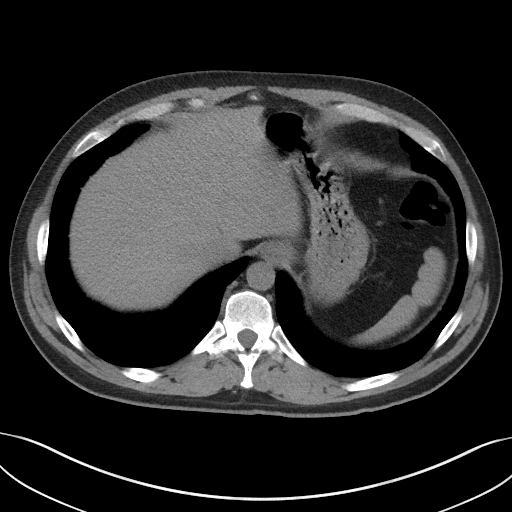
[im 85/97  soft-tissue]
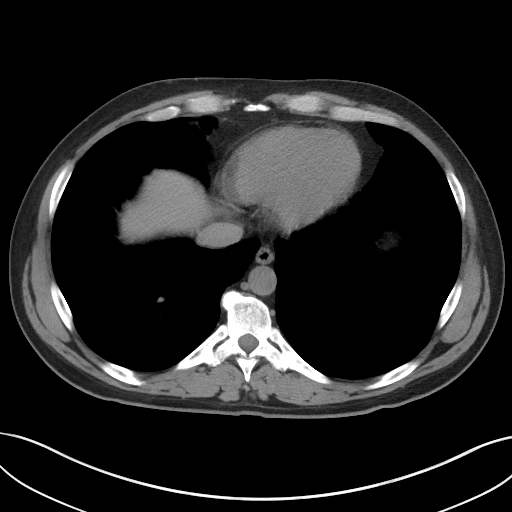
[im 91/97  soft-tissue]
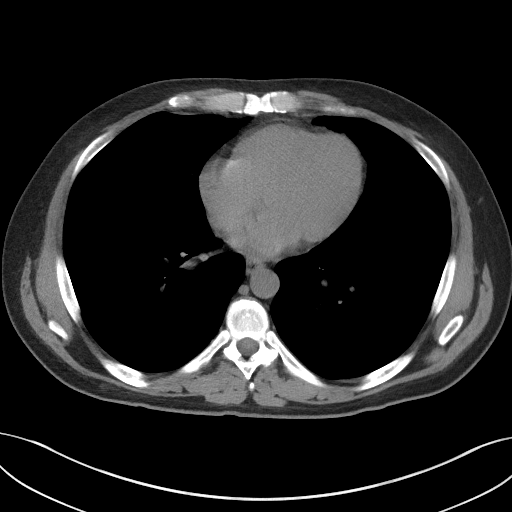

[Series 5: coronal st · coronal · 0.92mm/px · 3 of 119 slices shown]
[im 40/119  soft-tissue]
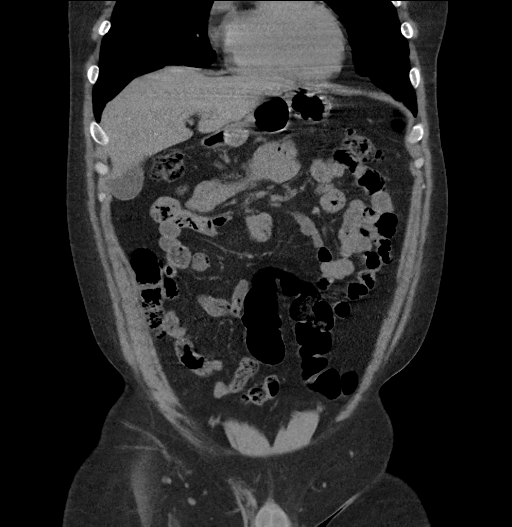
[im 53/119  soft-tissue]
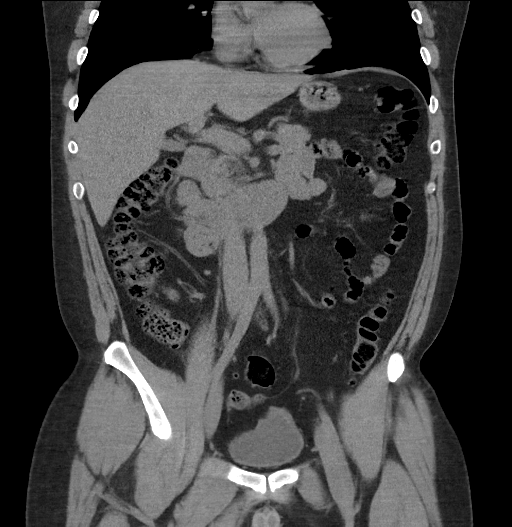
[im 66/119  soft-tissue]
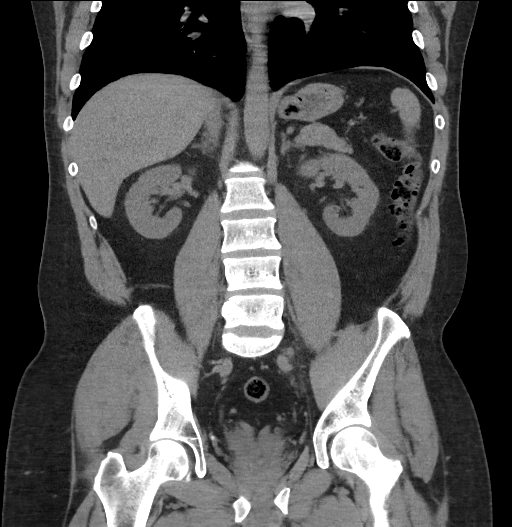

[17 of 46 positions shown; findings below may reference images not displayed]

FINDINGS: Lower chest: No acute abnormality.

Hepatobiliary: No focal liver abnormality is seen. No gallstones,
gallbladder wall thickening, or biliary dilatation.

Pancreas: Unremarkable. No pancreatic ductal dilatation or
surrounding inflammatory changes.

Spleen: Normal in size without focal abnormality.

Adrenals/Urinary Tract: Adrenal glands are unremarkable. Kidneys are
normal, without renal calculi, focal lesion, or hydronephrosis.
Bladder is unremarkable.

Stomach/Bowel: Stomach is within normal limits. Appendix appears
normal. No evidence of bowel wall thickening, distention, or
inflammatory changes.

Vascular/Lymphatic: No significant vascular findings are present. No
enlarged abdominal or pelvic lymph nodes.

Reproductive: Prostate is unremarkable.

Other: No abdominal wall hernia or abnormality. No abdominopelvic
ascites.

Musculoskeletal: No acute or significant osseous findings.
IMPRESSION: No abnormality seen in the abdomen or pelvis.

## 2022-12-12 ENCOUNTER — Encounter (HOSPITAL_COMMUNITY): Payer: Self-pay | Admitting: *Deleted

## 2022-12-12 ENCOUNTER — Emergency Department (HOSPITAL_COMMUNITY): Payer: 59

## 2022-12-12 ENCOUNTER — Emergency Department (HOSPITAL_COMMUNITY)
Admission: EM | Admit: 2022-12-12 | Discharge: 2022-12-12 | Disposition: A | Discharge status: Home / Self Care | Payer: 59 | Attending: Emergency Medicine | Admitting: Emergency Medicine

## 2022-12-12 ENCOUNTER — Other Ambulatory Visit: Payer: Self-pay

## 2022-12-12 DIAGNOSIS — S93402A Sprain of unspecified ligament of left ankle, initial encounter: Secondary | ICD-10-CM | POA: Insufficient documentation

## 2022-12-12 DIAGNOSIS — X501XXA Overexertion from prolonged static or awkward postures, initial encounter: Secondary | ICD-10-CM | POA: Insufficient documentation

## 2022-12-12 DIAGNOSIS — S99912A Unspecified injury of left ankle, initial encounter: Secondary | ICD-10-CM | POA: Diagnosis present

## 2022-12-12 NOTE — Discharge Instructions (Signed)
You were seen in the emergency department for foot pain. Your xray imaging was negative for fractures or dislocations, but the area of pain does concern me for an ankle sprain. I have given you a lace-up brace for added stability of the ankle. Please use this during the day with exceptions of bathing or sleeping to provide continued support. Follow up with your primary care provider for further evaluation in a week to ensure your ankle is healing well. Take Tylenol, Aleve, or Advil for pain.

## 2022-12-12 NOTE — ED Provider Notes (Signed)
EMERGENCY DEPARTMENT AT [facility] Provider Note   CSN: [id number] Arrival date & time: 12/12/22  1416     History Chief Complaint  Patient presents with   Foot Pain    Rickey [facility] is a 42 y.o. male.  Patient presents to the emergency department complaints of left foot pain since missing a step last night.  He reports that he was try to walk down some steps at his home when he accidentally missed the last step resulting in him rolling his ankle.  Difficulty bearing weight on this ankle at this point and has noted some slight bruising to the lateral aspect of the left foot.  No obvious bony deformity noted.  States that he has been using crutches to walk due to inability to bear weight on foot at this time.  No significant swelling or deformities been noted.  Not currently on blood thinners.   Foot Pain       Home Medications Prior to Admission medications   Medication Sig Start Date End Date Taking? Authorizing Provider  cyclobenzaprine (FLEXERIL) 10 MG tablet Take 1 tablet (10 mg total) by mouth 3 (three) times daily as needed. 12/05/18   Triplett, Tammy, PA-C  predniSONE (DELTASONE) 10 MG tablet Take 6 tablets day one, 5 tablets day two, 4 tablets day three, 3 tablets day four, 2 tablets day five, then 1 tablet day six 12/05/18   Triplett, Tammy, PA-C  traMADol (ULTRAM) 50 MG tablet Take 1 tablet (50 mg total) by mouth every 6 (six) hours as needed. 12/05/18   Triplett, Tammy, PA-C      Allergies    Oak bark [quercus robur]    Review of Systems   Review of Systems  Musculoskeletal:        Foot pain  All other systems reviewed and are negative.   Physical Exam Updated Vital Signs BP (!) 154/94   Pulse (!) 112   Temp 98.9 F (37.2 C) (Oral)   Resp 18   Ht 5\' 9"  (1.753 m)   Wt 101.2 kg   SpO2 99%   BMI 32.93 kg/m  Physical Exam Vitals and nursing note reviewed.  Constitutional:      General: He is not in acute distress.    Appearance: He is  well-developed.  HENT:     Head: Normocephalic and atraumatic.  Eyes:     Conjunctiva/sclera: Conjunctivae normal.  Cardiovascular:     Rate and Rhythm: Normal rate and regular rhythm.     Heart sounds: No murmur heard. Pulmonary:     Effort: Pulmonary effort is normal. No respiratory distress.     Breath sounds: Normal breath sounds.  Abdominal:     Palpations: Abdomen is soft.     Tenderness: There is no abdominal tenderness.  Musculoskeletal:        General: Tenderness and signs of injury present. No swelling or deformity.     Cervical back: Neck supple.     Comments: Limited range of motion in the left foot due to pain.  Tenderness to palpation along the lateral aspect of the left foot.  Mild tenderness to the lateral ankle.  Skin:    General: Skin is warm and dry.     Capillary Refill: Capillary refill takes less than 2 seconds.  Neurological:     Mental Status: He is alert.  Psychiatric:        Mood and Affect: Mood normal.     ED Results / Procedures /  Treatments   Labs (all labs ordered are listed, but only abnormal results are displayed) Labs Reviewed - No data to display  EKG None  Radiology DG Foot Complete Left  Result Date: 12/12/2022 CLINICAL DATA:  LEFT foot pain EXAM: LEFT FOOT - COMPLETE 3+ VIEW COMPARISON:  None Available. FINDINGS: Osseous alignment is normal. Bone mineralization is normal. No fracture line or displaced fracture fragment is seen. No degenerative change. Soft tissues about the LEFT foot are unremarkable. IMPRESSION: Negative. No fracture or dislocation. Electronically Signed   By: Bary Richard M.D.   On: 12/12/2022 14:45    Procedures Procedures   Medications Ordered in ED Medications - No data to display  ED Course/ Medical Decision Making/ A&P                           Medical Decision Making Amount and/or Complexity of Data Reviewed Radiology: ordered.   This patient presents to the ED for concern of foot pain.   Differential diagnosis includes foot fracture, ankle dislocation, DVT, acute arterial ischemia, cellulitis   Imaging Studies ordered:  I ordered imaging studies including x-ray of left foot I independently visualized and interpreted imaging which showed no evidence of any acute abnormality such as fracture or dislocation I agree with the radiologist interpretation   Problem List / ED Course:  Patient presented to the emergency department concerns of foot pain.  Reports that he missed a step last night on his steps at home when he rolled his ankle.  Unable to bear weight at this point and is requiring use of crutches for continued mobility.  Concerned that there is some bruising developing in the injured ankle.  Has tried managing symptoms at home with over-the-counter medications without significant improvement.  Will evaluate with x-ray imaging of the left foot for further evaluation. Imaging negative for any fracture or dislocation. Based on assessment, no concern for infection at this time as there is no erythema, warmth, or swelling. Will provide patient with ASO lace-up ankle brace for added support of left ankle in the setting of an ankle sprain. Did discuss possibility of an occult fracture that may not manifest on initial imaging that may necessitate further imaging if symptoms are not improving. Patient is agreeable with treatment plan and verbalized understanding all return precautions. All questions answered prior to patient discharge. Patient discharged home in good condition.   Final Clinical Impression(s) / ED Diagnoses Final diagnoses:  Sprain of left ankle, unspecified ligament, initial encounter    Rx / DC Orders ED Discharge Orders     None         Salomon Mast 12/12/22 1619    Rondel Baton, MD 12/17/22 1254

## 2022-12-12 NOTE — ED Triage Notes (Addendum)
Pt with left foot pain since missing a step last night.  Pt using crutches on arrival.
